# Patient Record
Sex: Female | Born: 1980 | Race: White | Hispanic: No | Marital: Single | Smoking: Current every day smoker
Health system: Southern US, Community
[De-identification: ages and names within clinical notes are randomized; demographics above are authoritative.]

## PROBLEM LIST (undated history)

## (undated) HISTORY — PX: TUBAL LIGATION: SHX77

## (undated) HISTORY — PX: CHOLECYSTECTOMY: SHX55

---

## 2020-04-07 ENCOUNTER — Emergency Department (HOSPITAL_BASED_OUTPATIENT_CLINIC_OR_DEPARTMENT_OTHER)
Admission: EM | Admit: 2020-04-07 | Discharge: 2020-04-08 | Disposition: A | Discharge status: Home / Self Care | Payer: Self-pay | Attending: Emergency Medicine | Admitting: Emergency Medicine

## 2020-04-07 ENCOUNTER — Emergency Department (HOSPITAL_BASED_OUTPATIENT_CLINIC_OR_DEPARTMENT_OTHER): Payer: Self-pay

## 2020-04-07 ENCOUNTER — Encounter (HOSPITAL_BASED_OUTPATIENT_CLINIC_OR_DEPARTMENT_OTHER): Payer: Self-pay | Admitting: *Deleted

## 2020-04-07 ENCOUNTER — Other Ambulatory Visit: Payer: Self-pay

## 2020-04-07 ENCOUNTER — Emergency Department (HOSPITAL_COMMUNITY): Payer: Self-pay

## 2020-04-07 DIAGNOSIS — Z20822 Contact with and (suspected) exposure to covid-19: Secondary | ICD-10-CM | POA: Insufficient documentation

## 2020-04-07 DIAGNOSIS — R112 Nausea with vomiting, unspecified: Secondary | ICD-10-CM | POA: Insufficient documentation

## 2020-04-07 DIAGNOSIS — N739 Female pelvic inflammatory disease, unspecified: Secondary | ICD-10-CM | POA: Insufficient documentation

## 2020-04-07 DIAGNOSIS — R000 Tachycardia, unspecified: Secondary | ICD-10-CM | POA: Insufficient documentation

## 2020-04-07 DIAGNOSIS — N730 Acute parametritis and pelvic cellulitis: Secondary | ICD-10-CM

## 2020-04-07 DIAGNOSIS — F17200 Nicotine dependence, unspecified, uncomplicated: Secondary | ICD-10-CM | POA: Insufficient documentation

## 2020-04-07 DIAGNOSIS — Z9851 Tubal ligation status: Secondary | ICD-10-CM | POA: Insufficient documentation

## 2020-04-07 LAB — CBC WITH DIFFERENTIAL/PLATELET
Abs Immature Granulocytes: 0.15 10*3/uL — ABNORMAL HIGH (ref 0.00–0.07)
Basophils Absolute: 0.1 10*3/uL (ref 0.0–0.1)
Basophils Relative: 0 %
Eosinophils Absolute: 0.1 10*3/uL (ref 0.0–0.5)
Eosinophils Relative: 0 %
HCT: 37.3 % (ref 36.0–46.0)
Hemoglobin: 12.4 g/dL (ref 12.0–15.0)
Immature Granulocytes: 1 %
Lymphocytes Relative: 12 %
Lymphs Abs: 2.5 10*3/uL (ref 0.7–4.0)
MCH: 28.2 pg (ref 26.0–34.0)
MCHC: 33.2 g/dL (ref 30.0–36.0)
MCV: 84.8 fL (ref 80.0–100.0)
Monocytes Absolute: 1.1 10*3/uL — ABNORMAL HIGH (ref 0.1–1.0)
Monocytes Relative: 5 %
Neutro Abs: 17.7 10*3/uL — ABNORMAL HIGH (ref 1.7–7.7)
Neutrophils Relative %: 82 %
Platelets: 355 10*3/uL (ref 150–400)
RBC: 4.4 MIL/uL (ref 3.87–5.11)
RDW: 14.6 % (ref 11.5–15.5)
WBC: 21.6 10*3/uL — ABNORMAL HIGH (ref 4.0–10.5)
nRBC: 0 % (ref 0.0–0.2)

## 2020-04-07 LAB — COMPREHENSIVE METABOLIC PANEL
ALT: 29 U/L (ref 0–44)
AST: 29 U/L (ref 15–41)
Albumin: 3.6 g/dL (ref 3.5–5.0)
Alkaline Phosphatase: 87 U/L (ref 38–126)
Anion gap: 11 (ref 5–15)
BUN: 10 mg/dL (ref 6–20)
CO2: 21 mmol/L — ABNORMAL LOW (ref 22–32)
Calcium: 9.1 mg/dL (ref 8.9–10.3)
Chloride: 104 mmol/L (ref 98–111)
Creatinine, Ser: 0.69 mg/dL (ref 0.44–1.00)
GFR, Estimated: >60 mL/min (ref >60)
Glucose, Bld: 102 mg/dL — ABNORMAL HIGH (ref 70–99)
Potassium: 3.5 mmol/L (ref 3.5–5.1)
Sodium: 136 mmol/L (ref 135–145)
Total Bilirubin: 1 mg/dL (ref 0.3–1.2)
Total Protein: 7.1 g/dL (ref 6.5–8.1)

## 2020-04-07 LAB — WET PREP, GENITAL
Sperm: NONE SEEN
Trich, Wet Prep: NONE SEEN
Yeast Wet Prep HPF POC: NONE SEEN

## 2020-04-07 LAB — URINALYSIS, ROUTINE W REFLEX MICROSCOPIC
Bilirubin Urine: NEGATIVE
Glucose, UA: NEGATIVE mg/dL
Hgb urine dipstick: NEGATIVE
Ketones, ur: NEGATIVE mg/dL
Nitrite: NEGATIVE
Protein, ur: NEGATIVE mg/dL
Specific Gravity, Urine: 1.015 (ref 1.005–1.030)
pH: 8.5 — ABNORMAL HIGH (ref 5.0–8.0)

## 2020-04-07 LAB — LACTIC ACID, PLASMA
Lactic Acid, Venous: 0.9 mmol/L (ref 0.5–1.9)
Lactic Acid, Venous: 0.9 mmol/L (ref 0.5–1.9)

## 2020-04-07 LAB — URINALYSIS, MICROSCOPIC (REFLEX)

## 2020-04-07 LAB — RESP PANEL BY RT-PCR (FLU A&B, COVID) ARPGX2
Influenza A by PCR: NEGATIVE
Influenza B by PCR: NEGATIVE
SARS Coronavirus 2 by RT PCR: NEGATIVE

## 2020-04-07 LAB — PREGNANCY, URINE: Preg Test, Ur: NEGATIVE

## 2020-04-07 LAB — HIV ANTIBODY (ROUTINE TESTING W REFLEX): HIV Screen 4th Generation wRfx: NONREACTIVE

## 2020-04-07 LAB — LIPASE, BLOOD: Lipase: 22 U/L (ref 11–51)

## 2020-04-07 MED ORDER — IBUPROFEN 800 MG PO TABS
800.0000 mg | ORAL_TABLET | Freq: Once | ORAL | Status: AC
  Administered 2020-04-07: 800 mg via ORAL
  Filled 2020-04-07: qty 1

## 2020-04-07 MED ORDER — MORPHINE SULFATE (PF) 4 MG/ML IV SOLN
4.0000 mg | Freq: Once | INTRAVENOUS | Status: AC
  Administered 2020-04-07: 4 mg via INTRAVENOUS
  Filled 2020-04-07: qty 1

## 2020-04-07 MED ORDER — SODIUM CHLORIDE 0.9 % IV BOLUS
1000.0000 mL | Freq: Once | INTRAVENOUS | Status: AC
  Administered 2020-04-07: 1000 mL via INTRAVENOUS

## 2020-04-07 MED ORDER — SODIUM CHLORIDE 0.9 % IV BOLUS
500.0000 mL | Freq: Once | INTRAVENOUS | Status: AC
  Administered 2020-04-07: 500 mL via INTRAVENOUS

## 2020-04-07 MED ORDER — ONDANSETRON HCL 4 MG/2ML IJ SOLN
4.0000 mg | Freq: Once | INTRAMUSCULAR | Status: AC
  Administered 2020-04-07: 4 mg via INTRAVENOUS
  Filled 2020-04-07: qty 2

## 2020-04-07 MED ORDER — PIPERACILLIN-TAZOBACTAM 3.375 G IVPB 30 MIN
3.3750 g | Freq: Once | INTRAVENOUS | Status: AC
  Administered 2020-04-07: 3.375 g via INTRAVENOUS
  Filled 2020-04-07: qty 50

## 2020-04-07 MED ORDER — HYDROMORPHONE HCL 1 MG/ML IJ SOLN
1.0000 mg | Freq: Once | INTRAMUSCULAR | Status: AC
  Administered 2020-04-07: 1 mg via INTRAVENOUS
  Filled 2020-04-07: qty 1

## 2020-04-07 MED ORDER — IOHEXOL 300 MG/ML  SOLN
100.0000 mL | Freq: Once | INTRAMUSCULAR | Status: AC | PRN
  Administered 2020-04-07: 100 mL via INTRAVENOUS

## 2020-04-07 MED ORDER — HYDROMORPHONE HCL 1 MG/ML IJ SOLN
0.5000 mg | Freq: Once | INTRAMUSCULAR | Status: AC
  Administered 2020-04-07: 0.5 mg via INTRAVENOUS
  Filled 2020-04-07: qty 1

## 2020-04-07 MED ORDER — PROMETHAZINE HCL 25 MG/ML IJ SOLN
12.5000 mg | Freq: Once | INTRAMUSCULAR | Status: AC
  Administered 2020-04-07: 12.5 mg via INTRAVENOUS
  Filled 2020-04-07: qty 1

## 2020-04-07 MED ORDER — SODIUM CHLORIDE 0.9 % IV SOLN
100.0000 mg | Freq: Once | INTRAVENOUS | Status: AC
  Administered 2020-04-07: 100 mg via INTRAVENOUS
  Filled 2020-04-07 (×2): qty 100

## 2020-04-07 NOTE — ED Triage Notes (Addendum)
C/o lower pelvic/abd pain and urinary freq and pain  x 12 hrs

## 2020-04-07 NOTE — ED Notes (Signed)
Patient transported to CT 

## 2020-04-07 NOTE — ED Notes (Signed)
Pt called out saying she was feeling nauseous and having "cold sweats"; this RN assessed pt, found pt to be stable,  and informed EDP of pt symptoms; EDP to order nausea medicine and will talk to pt

## 2020-04-07 NOTE — ED Notes (Signed)
Pt with large emesis. States " I feel better since I threw up".

## 2020-04-07 NOTE — ED Notes (Signed)
ED Provider at bedside. 

## 2020-04-07 NOTE — ED Notes (Signed)
EDP at bedside  

## 2020-04-07 NOTE — ED Provider Notes (Signed)
[facility] EMERGENCY DEPARTMENT Provider Note   CSN: [id number] Arrival date & time: 04/07/20  1739     History Chief Complaint  Patient presents with  . Abdominal Pain    Tamara Campos is a 39 y.o. female.  Tamara Campos is a 39 y.o. female with a history of obesity, prior cholecystectomy, tubal ligation and C-section, reportedly otherwise healthy but has not seen a doctor in many years, who presents to the emergency department for evaluation of lower abdominal pain.  She states that over the past week she has had some dull aching crampy pain that is been intermittent, but over the past 12 hours she has had severe sharp and constant pains in the lower abdomen, they do not localize to one side.  She is also had multiple episodes of vomiting.  She denies any hematemesis.  She has had hot and cold chills at home but was not aware she had a fever until arriving here, febrile to 100.9.  She denies any diarrhea or constipation, no blood in the stool.  Has not had any dysuria or urinary frequency.  Denies any flank pain or blood in the stool.  Denies any vaginal bleeding or discharge.  Last menstrual period on 11/2.  No meds prior to arrival to treat symptoms.  No other aggravating or alleviating factors.        History reviewed. No pertinent past medical history.  There are no problems to display for this patient.   Past Surgical History:  Procedure Laterality Date  . CESAREAN SECTION    . CHOLECYSTECTOMY    . TUBAL LIGATION       OB History   No obstetric history on file.     No family history on file.  Social History   Tobacco Use  . Smoking status: Current Every Day Smoker    Packs/day: 0.50  . Smokeless tobacco: Never Used  Vaping Use  . Vaping Use: Never used  Substance Use Topics  . Alcohol use: Not Currently  . Drug use: Yes    Types: Marijuana    Home Medications Prior to Admission medications   Not on File    Allergies    Patient has no known  allergies.  Review of Systems   Review of Systems  Constitutional: Positive for chills and fever.  HENT: Negative for congestion, rhinorrhea and sore throat.   Eyes: Negative for visual disturbance.  Respiratory: Negative for cough and shortness of breath.   Cardiovascular: Negative for chest pain.  Gastrointestinal: Positive for abdominal pain, nausea and vomiting. Negative for blood in stool, constipation and diarrhea.  Genitourinary: Negative for dysuria, flank pain, frequency, hematuria, vaginal bleeding and vaginal discharge.  Musculoskeletal: Negative for arthralgias and myalgias.  Skin: Negative for color change and rash.  Neurological: Negative for syncope, light-headedness and headaches.  All other systems reviewed and are negative.   Physical Exam Updated Vital Signs BP 122/83   Pulse (!) 108   Temp (!) 100.9 F (38.3 C) (Oral)   Resp 18   Ht 5\' 9"  (1.753 m)   Wt 113.4 kg   LMP 03/17/2020   SpO2 98%   BMI 36.92 kg/m   Physical Exam Vitals and nursing note reviewed.  Constitutional:      General: She is in acute distress.     Appearance: She is well-developed. She is obese. She is ill-appearing.     Comments: Patient is alert, very uncomfortable, tearful, ill-appearing and in some distress  HENT:  Head: Normocephalic and atraumatic.     Mouth/Throat:     Mouth: Mucous membranes are moist.     Pharynx: Oropharynx is clear.  Eyes:     Pupils: Pupils are equal, round, and reactive to light.  Cardiovascular:     Rate and Rhythm: Regular rhythm. Tachycardia present.     Heart sounds: Normal heart sounds. No murmur heard.  No friction rub. No gallop.      Comments: Mild tachycardia with regular rhythm Pulmonary:     Effort: Pulmonary effort is normal.     Breath sounds: Normal breath sounds. No wheezing, rhonchi or rales.     Comments: Respirations equal and unlabored, patient able to speak in full sentences, lungs clear to auscultation  bilaterally Abdominal:     General: Bowel sounds are normal. There is no distension.     Palpations: Abdomen is soft. There is no mass.     Tenderness: There is abdominal tenderness. There is guarding.     Comments: Obese abdomen soft and nondistended, mild upper abdominal tenderness but patient is significantly tender throughout the lower abdomen with guarding on palpation, pain does not localize to 1 side, no CVA tenderness bilaterally  Genitourinary:    Comments: Chaperone present during pelvic exam. No external genital lesions noted. On speculum exam there is copious amounts of thick yellow discharge present, cervix is erythematous On bimanual exam patient has pain with cervical motion tenderness as well as pain with palpation over the uterus and left adnexal tenderness without palpable masses, no right adnexal tenderness Musculoskeletal:        General: No deformity.     Cervical back: Neck supple.  Skin:    General: Skin is warm and dry.  Neurological:     General: No focal deficit present.     Mental Status: She is alert and oriented to person, place, and time.  Psychiatric:        Mood and Affect: Mood normal.        Behavior: Behavior normal.     ED Results / Procedures / Treatments   Labs (all labs ordered are listed, but only abnormal results are displayed) Labs Reviewed  URINALYSIS, ROUTINE W REFLEX MICROSCOPIC - Abnormal; Notable for the following components:      Result Value   APPearance HAZY (*)    pH 8.5 (*)    Leukocytes,Ua TRACE (*)    All other components within normal limits  CBC WITH DIFFERENTIAL/PLATELET - Abnormal; Notable for the following components:   WBC 21.6 (*)    Neutro Abs 17.7 (*)    Monocytes Absolute 1.1 (*)    Abs Immature Granulocytes 0.15 (*)    All other components within normal limits  URINALYSIS, MICROSCOPIC (REFLEX) - Abnormal; Notable for the following components:   Bacteria, UA FEW (*)    All other components within normal limits   URINE CULTURE  RESP PANEL BY RT-PCR (FLU A&B, COVID) ARPGX2  PREGNANCY, URINE  LACTIC ACID, PLASMA  COMPREHENSIVE METABOLIC PANEL  LIPASE, BLOOD  LACTIC ACID, PLASMA    EKG None  Radiology CT ABDOMEN PELVIS W CONTRAST  Result Date: 04/07/2020 CLINICAL DATA:  Lower abdominal pain. EXAM: CT ABDOMEN AND PELVIS WITH CONTRAST TECHNIQUE: Multidetector CT imaging of the abdomen and pelvis was performed using the standard protocol following bolus administration of intravenous contrast. CONTRAST:  OMNIPAQUE IOHEXOL 300 MG/ML  SOLN COMPARISON:  None. FINDINGS: Lower chest: No acute abnormality. Hepatobiliary: No focal liver abnormality is seen. Status post  cholecystectomy. No biliary dilatation. Pancreas: Unremarkable. No pancreatic ductal dilatation or surrounding inflammatory changes. Spleen: Normal in size without focal abnormality. Adrenals/Urinary Tract: Adrenal glands are unremarkable. Kidneys are normal, without obstructing renal calculi, focal lesion, or hydronephrosis. A 3 mm nonobstructing renal stone is seen within the anterior aspect of the mid to lower right kidney. Bladder is unremarkable. Stomach/Bowel: Stomach is within normal limits. Appendix appears normal. No evidence of bowel wall thickening, distention, or inflammatory changes. Vascular/Lymphatic: No significant vascular findings are present. No enlarged abdominal or pelvic lymph nodes. Reproductive: The uterus is normal in appearance. 2.9 cm and 1.1 cm cysts are seen within the anteromedial aspect of the left adnexa. A mild amount of adjacent, nonspecific mesenteric inflammatory fat stranding is seen. Other: No abdominal wall hernia or abnormality. No abdominopelvic ascites. Musculoskeletal: No acute or significant osseous findings. IMPRESSION: 1.   3 mm nonobstructing renal stone within the right kidney. 2. Left adnexal cysts, likely ovarian in origin. Due to the size of these cysts (less than 5 cm) and the patient's age (less  than 36 years old), no additional imaging is required. 3. Mild, nonspecific pelvic inflammatory fat stranding, as described above. Due to its proximity to the left adnexa, sequelae associated with pelvic inflammatory disease cannot be excluded. Electronically Signed   By: Aram Candela M.D.   On: 04/07/2020 19:55    Procedures Procedures (including critical care time)  Medications Ordered in ED Medications  piperacillin-tazobactam (ZOSYN) IVPB 3.375 g (has no administration in time range)  morphine 4 MG/ML injection 4 mg (has no administration in time range)  ibuprofen (ADVIL) tablet 800 mg (800 mg Oral Given 04/07/20 1756)  sodium chloride 0.9 % bolus 1,000 mL (1,000 mLs Intravenous New Bag/Given 04/07/20 1826)  ondansetron (ZOFRAN) injection 4 mg (4 mg Intravenous Given 04/07/20 1823)  morphine 4 MG/ML injection 4 mg (4 mg Intravenous Given 04/07/20 1827)    ED Course  I have reviewed the triage vital signs and the nursing notes.  Pertinent labs & imaging results that were available during my care of the patient were reviewed by me and considered in my medical decision making (see chart for details).    MDM Rules/Calculators/A&P                         39 year old female arrives with 12 hours of severe lower abdominal pain with nausea and vomiting.  On arrival she is febrile and mildly tachycardic.  Ill-appearing.  High concern for acute intra-abdominal pathology.  Initially patient denied any urinary symptoms, vaginal discharge or bleeding.  Higher suspicion for possible appendicitis, diverticulitis, colitis.  Will get abdominal labs, lactic acid, urinalysis and CT abdomen pelvis.  IV fluids, pain and nausea medication given.  I have independently ordered, reviewed and interpreted all labs and imaging: CBC: Significant leukocytosis of 21.6 with associated fever and tachycardia patient meets SIRS criteria with likely intra-abdominal source, will give dose of IV Zosyn CMP: CO2 of 21  and glucose of 102 but no other significant electrolyte derangements, normal renal and liver function Lipase: WNL Lactic acid: Not elevated UA: Trace leukocytes, few bacteria on microscopic with some white cells, but squamous cells present as well, without urinary symptoms low suspicion that this is patient source of infection  CT abdomen pelvis with 3 mm nonobstructing renal stone in the right kidney, 2 left adnexal cysts noted likely ovarian in origin, there is also nonspecific pelvic inflammatory fat stranding that is close in proximity  to the left adnexa concerning for possible sequelae of PID.  Appendix appears normal, no other acute abnormalities noted.  Given CT findings will perform pelvic exam, patient will need pelvic ultrasound as well but this is not available at this emergency department currently.  On pelvic exam patient has copious amounts of thick yellow discharge, she also has cervical motion tenderness and tenderness with palpation over the uterus and left adnexa, exam again raises concern for PID.  Given fever, leukocytosis and CT findings I have high clinical suspicion for possible TOA.  Patient requires emergent transfer for pelvic ultrasound, and then will likely require gynecology consult.  Patient will need to be transferred to [facility] for pelvic ultrasound, high clinical suspicion for PID with concern for TOA given stranding and changes noted on CT, and copious amounts of yellow discharge noted on pelvic exam with extreme discomfort during exam.  Case discussed with Dr. Judd Lien in the [facility], ED who accepts patient for transfer, patient will be transported via [facility] to the emergency department for further evaluation.  High likelihood that patient may need admission.  She continues to have severe pain and has required multiple doses of IV pain medication as well as antiemetics.  I called and discussed antibiotic regimen with pharmacist Lysle Pearl given that patient  received Zosyn earlier she recommended adding on doxycycline, but holding off on any additional antibiotics until patient is due for her next dose and then they can potentially narrow therapy.  Final Clinical Impression(s) / ED Diagnoses Final diagnoses:  PID (acute pelvic inflammatory disease)    Rx / DC Orders ED Discharge Orders    None       Legrand Rams 04/07/20 2256    Tilden Fossa, MD 04/07/20 [phone]

## 2020-04-08 LAB — RPR: RPR Ser Ql: NONREACTIVE

## 2020-04-08 MED ORDER — STERILE WATER FOR INJECTION IJ SOLN
INTRAMUSCULAR | Status: AC
  Filled 2020-04-08: qty 10

## 2020-04-08 MED ORDER — DOXYCYCLINE HYCLATE 100 MG PO CAPS: 100.0000 mg | ORAL_CAPSULE | Freq: Two times a day (BID) | ORAL | 0 refills | Status: AC

## 2020-04-08 MED ORDER — HYDROCODONE-ACETAMINOPHEN 5-325 MG PO TABS
0.5000 | ORAL_TABLET | Freq: Three times a day (TID) | ORAL | 0 refills | Status: AC | PRN
Start: 2020-04-08 — End: 2020-04-11

## 2020-04-08 MED ORDER — CEFTRIAXONE SODIUM 500 MG IJ SOLR
500.0000 mg | Freq: Once | INTRAMUSCULAR | Status: AC
  Administered 2020-04-08: 500 mg via INTRAMUSCULAR
  Filled 2020-04-08: qty 500

## 2020-04-08 MED ORDER — HYDROCODONE-ACETAMINOPHEN 5-325 MG PO TABS
1.0000 | ORAL_TABLET | Freq: Once | ORAL | Status: AC
  Administered 2020-04-08: 1 via ORAL
  Filled 2020-04-08: qty 1

## 2020-04-08 NOTE — ED Provider Notes (Signed)
I assumed care of this patient.  Please see previous provider note for further details of Hx, PE.  Briefly patient is a 39 y.o. female who presented abd pain. Work up was concerning for PID. Sent to r/o TOA.  [place] w/o evidence of TOA. Patient offered admission for pain control and additional Abx, but she declined.  Will keep until the am, when she will be able to get a ride back to her car at [facility].  The patient appears reasonably screened and/or stabilized for discharge and I doubt any other medical condition or other [facility] requiring further screening, evaluation, or treatment in the ED at this time prior to discharge. Safe for discharge with strict return precautions.  Disposition: Discharge  Condition: Good  I have discussed the results, Dx and Tx plan with the patient/family who expressed understanding and agree(s) with the plan. Discharge instructions discussed at length. The patient/family was given strict return precautions who verbalized understanding of the instructions. No further questions at time of discharge.    ED Discharge Orders         Ordered    doxycycline (VIBRAMYCIN) 100 MG capsule  2 times daily        04/08/20 0519    HYDROcodone-acetaminophen (NORCO/VICODIN) 5-325 MG tablet  Every 8 hours PRN        04/08/20 0519          [facility] narcotic database reviewed and no active prescriptions noted.    Nira Conn, MD 04/08/20 [phone]

## 2020-04-08 NOTE — ED Notes (Signed)
Patient verbalizes understanding of discharge instructions. Opportunity for questioning and answers were provided. Armband removed by staff, pt discharged from ED and ambulated to lobby to wait for taxi.

## 2020-04-08 NOTE — Discharge Instructions (Addendum)
For pain control you may take at 1000 mg of Tylenol every 8 hours scheduled.  In addition you can take 0.5 to 1 tablet of Norco/Vicodin every 8 hours for pain not controlled with the scheduled Tylenol.  

## 2020-04-09 LAB — URINE CULTURE

## 2020-04-10 LAB — GC/CHLAMYDIA PROBE AMP (~~LOC~~) NOT AT ARMC
Chlamydia: NEGATIVE
Comment: NEGATIVE
Comment: NORMAL
Neisseria Gonorrhea: POSITIVE — AB

## 2021-06-26 IMAGING — CT CT ABD-PELV W/ CM
2 of 4 series · 16 of 46 positions shown, 18 images · IV contrast (Omnipaque)
Comparison: None.

CLINICAL DATA: Lower abdominal pain.

EXAM:
CT ABDOMEN AND PELVIS WITH CONTRAST
TECHNIQUE: Multidetector CT imaging of the abdomen and pelvis was performed
using the standard protocol following bolus administration of
intravenous contrast.
CONTRAST:  100mL OMNIPAQUE IOHEXOL 300 MG/ML  SOLN

[Series 2: axial st · axial · 0.90mm/px · z∈[-637,-217]mm · 13 of 94 slices shown, 15 images]
[im 5/94  soft-tissue]
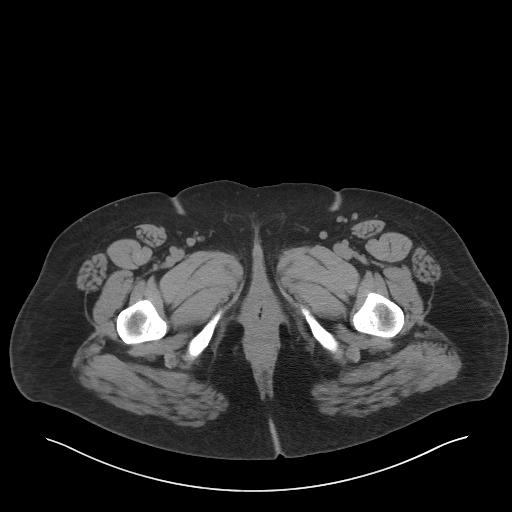
[im 5/94  bone]
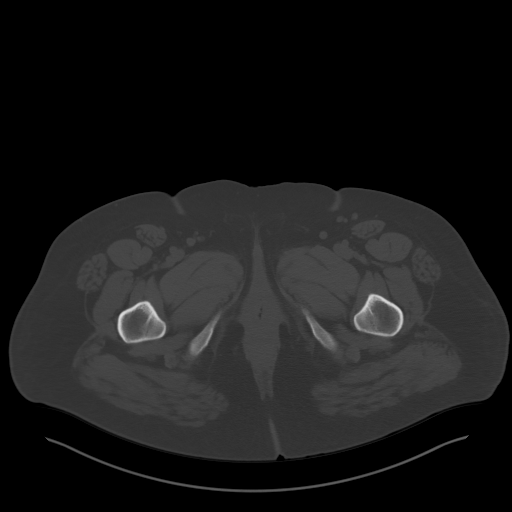
[im 13/94  soft-tissue]
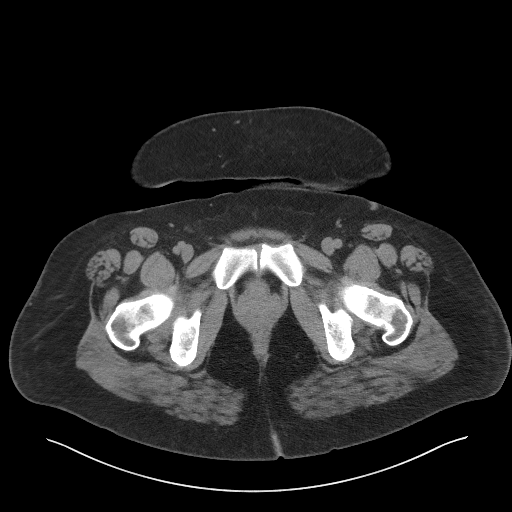
[im 21/94  soft-tissue]
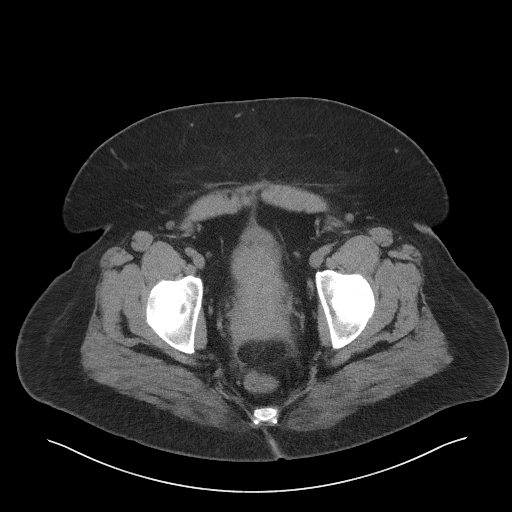
[im 25/94  soft-tissue]
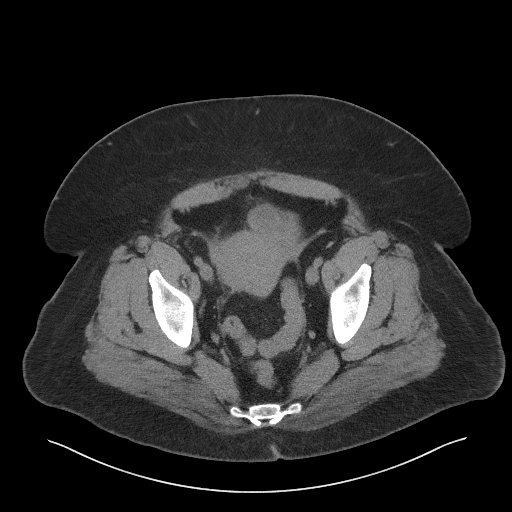
[im 33/94  soft-tissue]
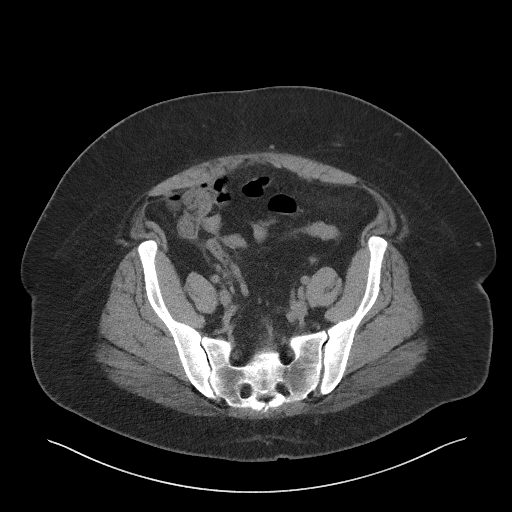
[im 41/94  soft-tissue]
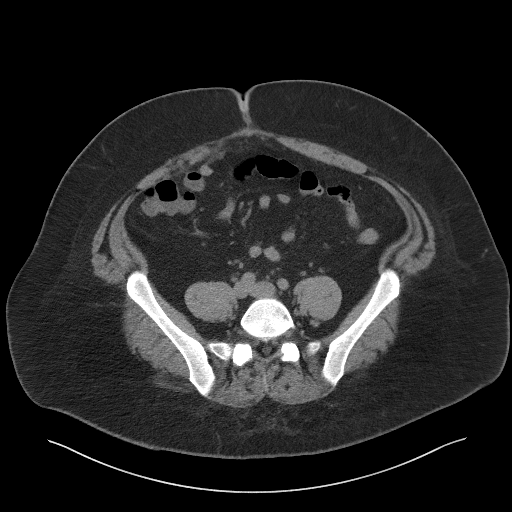
[im 49/94  soft-tissue]
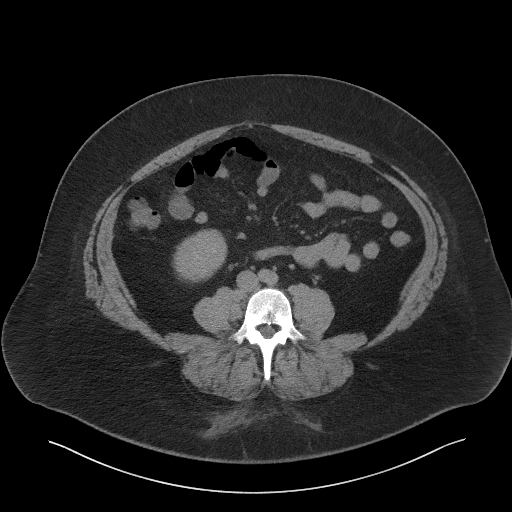
[im 53/94  soft-tissue]
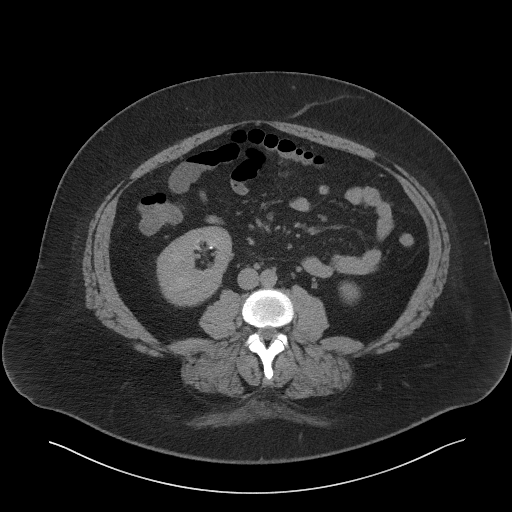
[im 61/94  soft-tissue]
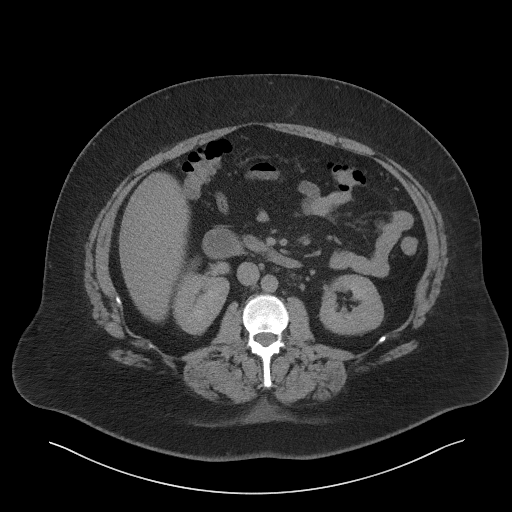
[im 61/94  bone]
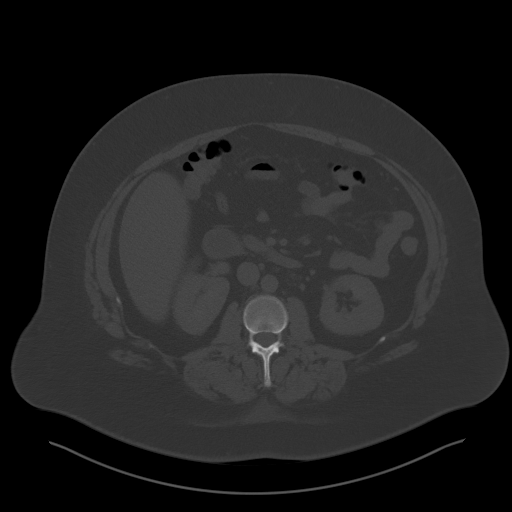
[im 69/94  soft-tissue]
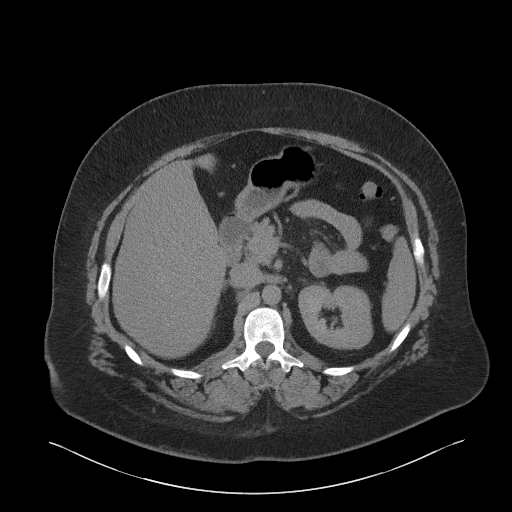
[im 73/94  soft-tissue]
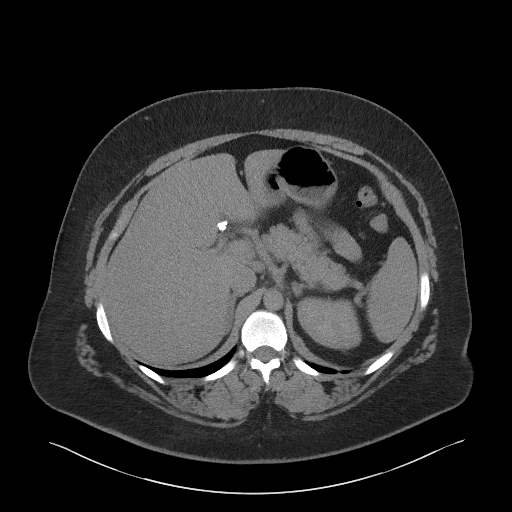
[im 81/94  soft-tissue]
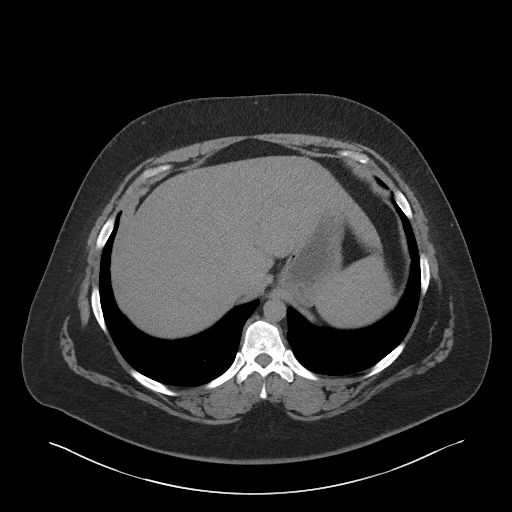
[im 89/94  soft-tissue]
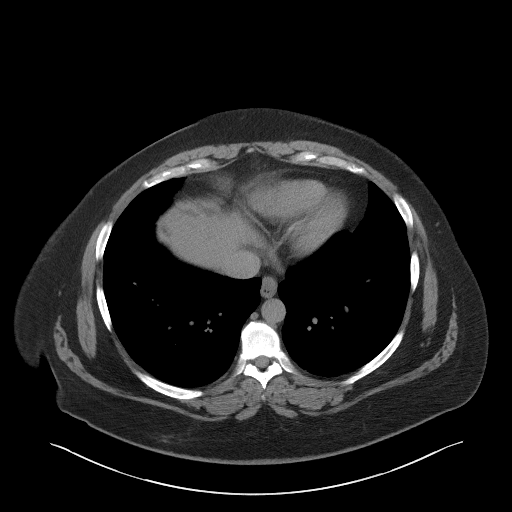

[Series 5: coronal st · coronal · 0.74mm/px · 3 of 120 slices shown]
[im 40/120  soft-tissue]
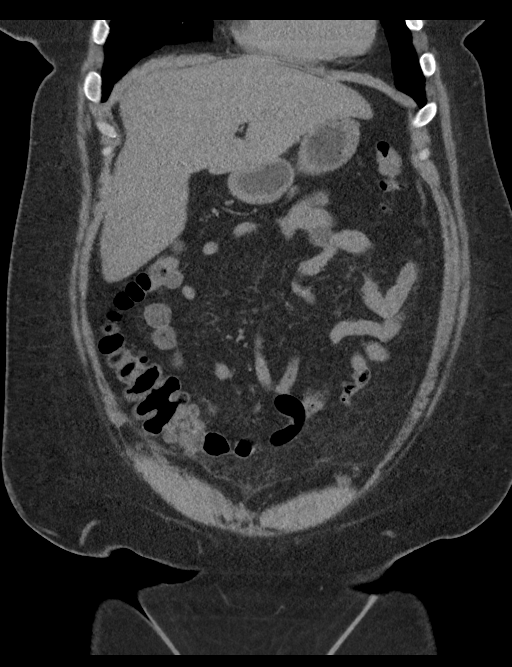
[im 53/120  soft-tissue]
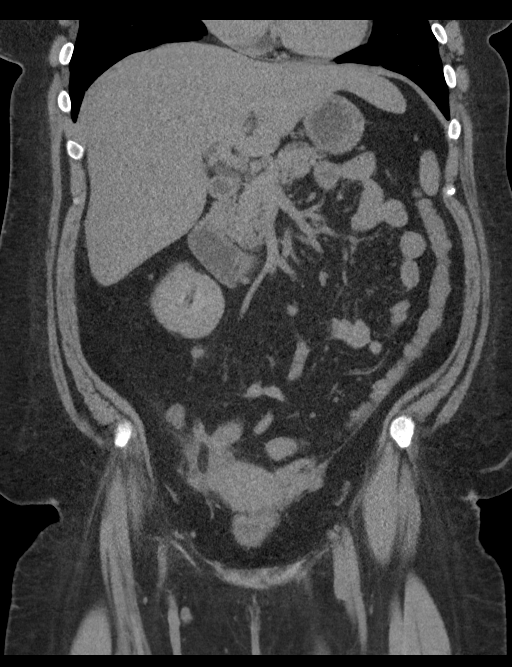
[im 67/120  soft-tissue]
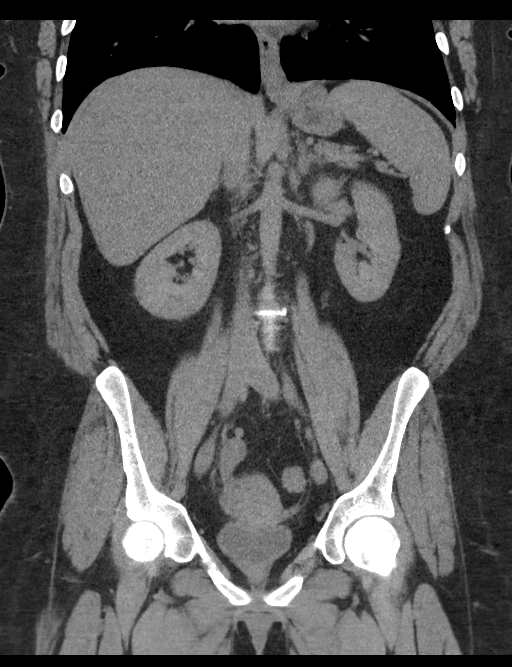

[16 of 46 positions shown; findings below may reference images not displayed]

FINDINGS: Lower chest: No acute abnormality.

Hepatobiliary: No focal liver abnormality is seen. Status post
cholecystectomy. No biliary dilatation.

Pancreas: Unremarkable. No pancreatic ductal dilatation or
surrounding inflammatory changes.

Spleen: Normal in size without focal abnormality.

Adrenals/Urinary Tract: Adrenal glands are unremarkable. Kidneys are
normal, without obstructing renal calculi, focal lesion, or
hydronephrosis. A 3 mm nonobstructing renal stone is seen within the
anterior aspect of the mid to lower right kidney. Bladder is
unremarkable.

Stomach/Bowel: Stomach is within normal limits. Appendix appears
normal. No evidence of bowel wall thickening, distention, or
inflammatory changes.

Vascular/Lymphatic: No significant vascular findings are present. No
enlarged abdominal or pelvic lymph nodes.

Reproductive: The uterus is normal in appearance. 2.9 cm and 1.1 cm
cysts are seen within the anteromedial aspect of the left adnexa. A
mild amount of adjacent, nonspecific mesenteric inflammatory fat
stranding is seen.

Other: No abdominal wall hernia or abnormality. No abdominopelvic
ascites.

Musculoskeletal: No acute or significant osseous findings.
IMPRESSION: 1.   3 mm nonobstructing renal stone within the right kidney.
2. Left adnexal cysts, likely ovarian in origin. Due to the size of
these cysts (less than 5 cm) and the patient's age (less than 50
years old), no additional imaging is required.
3. Mild, nonspecific pelvic inflammatory fat stranding, as described
above. Due to its proximity to the left adnexa, sequelae associated
with pelvic inflammatory disease cannot be excluded.
